# Patient Record
Sex: Female | Born: 1965 | Race: White | Hispanic: No | Marital: Married
Health system: Southern US, Community
[De-identification: ages and names within clinical notes are randomized; demographics above are authoritative.]

---

## 2013-12-26 ENCOUNTER — Other Ambulatory Visit: Payer: Self-pay | Admitting: Obstetrics & Gynecology

## 2013-12-26 DIAGNOSIS — N644 Mastodynia: Secondary | ICD-10-CM

## 2013-12-29 ENCOUNTER — Ambulatory Visit
Admission: RE | Admit: 2013-12-29 | Discharge: 2013-12-29 | Disposition: A | Discharge status: Home / Self Care | Payer: 59 | Source: Ambulatory Visit | Attending: Obstetrics & Gynecology | Admitting: Obstetrics & Gynecology

## 2013-12-29 DIAGNOSIS — N644 Mastodynia: Secondary | ICD-10-CM

## 2014-01-02 ENCOUNTER — Other Ambulatory Visit: Payer: Self-pay

## 2014-08-13 ENCOUNTER — Other Ambulatory Visit: Payer: Self-pay | Admitting: Obstetrics & Gynecology

## 2014-08-13 DIAGNOSIS — N63 Unspecified lump in unspecified breast: Secondary | ICD-10-CM

## 2014-08-18 ENCOUNTER — Other Ambulatory Visit: Payer: Self-pay

## 2014-08-19 ENCOUNTER — Ambulatory Visit
Admission: RE | Admit: 2014-08-19 | Discharge: 2014-08-19 | Disposition: A | Discharge status: Home / Self Care | Payer: BLUE CROSS/BLUE SHIELD | Source: Ambulatory Visit | Attending: Obstetrics & Gynecology | Admitting: Obstetrics & Gynecology

## 2014-08-19 DIAGNOSIS — N63 Unspecified lump in unspecified breast: Secondary | ICD-10-CM

## 2015-02-19 ENCOUNTER — Encounter (HOSPITAL_BASED_OUTPATIENT_CLINIC_OR_DEPARTMENT_OTHER): Payer: Self-pay

## 2015-02-19 ENCOUNTER — Ambulatory Visit (HOSPITAL_BASED_OUTPATIENT_CLINIC_OR_DEPARTMENT_OTHER): Admit: 2015-02-19 | Payer: BLUE CROSS/BLUE SHIELD | Admitting: Obstetrics & Gynecology

## 2015-02-19 SURGERY — LIGATION, FALLOPIAN TUBE, LAPAROSCOPIC
Anesthesia: General

## 2015-11-18 ENCOUNTER — Other Ambulatory Visit: Payer: Self-pay | Admitting: Nurse Practitioner

## 2015-11-18 DIAGNOSIS — N6320 Unspecified lump in the left breast, unspecified quadrant: Secondary | ICD-10-CM

## 2015-11-18 DIAGNOSIS — N6310 Unspecified lump in the right breast, unspecified quadrant: Secondary | ICD-10-CM

## 2015-11-23 ENCOUNTER — Ambulatory Visit
Admission: RE | Admit: 2015-11-23 | Discharge: 2015-11-23 | Disposition: A | Discharge status: Home / Self Care | Payer: BLUE CROSS/BLUE SHIELD | Source: Ambulatory Visit | Attending: Nurse Practitioner | Admitting: Nurse Practitioner

## 2015-11-23 DIAGNOSIS — N6310 Unspecified lump in the right breast, unspecified quadrant: Secondary | ICD-10-CM

## 2015-11-23 DIAGNOSIS — N6320 Unspecified lump in the left breast, unspecified quadrant: Secondary | ICD-10-CM

## 2018-12-18 ENCOUNTER — Other Ambulatory Visit: Payer: Self-pay | Admitting: Obstetrics & Gynecology

## 2018-12-18 DIAGNOSIS — N644 Mastodynia: Secondary | ICD-10-CM

## 2019-01-01 ENCOUNTER — Ambulatory Visit
Admission: RE | Admit: 2019-01-01 | Discharge: 2019-01-01 | Disposition: A | Discharge status: Home / Self Care | Payer: BC Managed Care – PPO | Source: Ambulatory Visit | Attending: Obstetrics & Gynecology | Admitting: Obstetrics & Gynecology

## 2019-01-01 ENCOUNTER — Other Ambulatory Visit: Payer: Self-pay

## 2019-01-01 ENCOUNTER — Ambulatory Visit: Payer: BLUE CROSS/BLUE SHIELD

## 2019-01-01 DIAGNOSIS — N644 Mastodynia: Secondary | ICD-10-CM

## 2020-03-30 IMAGING — MG DIGITAL DIAGNOSTIC BILATERAL MAMMOGRAM WITH TOMO AND CAD
8 series · 8 of 24 positions shown · non-contrast
Comparison: Previous exam(s).

CLINICAL DATA: 53-year-old female with diffuse bilateral breast
pain for several months.

EXAM:
DIGITAL DIAGNOSTIC BILATERAL MAMMOGRAM WITH CAD AND TOMO

[L MLO synth-2D]
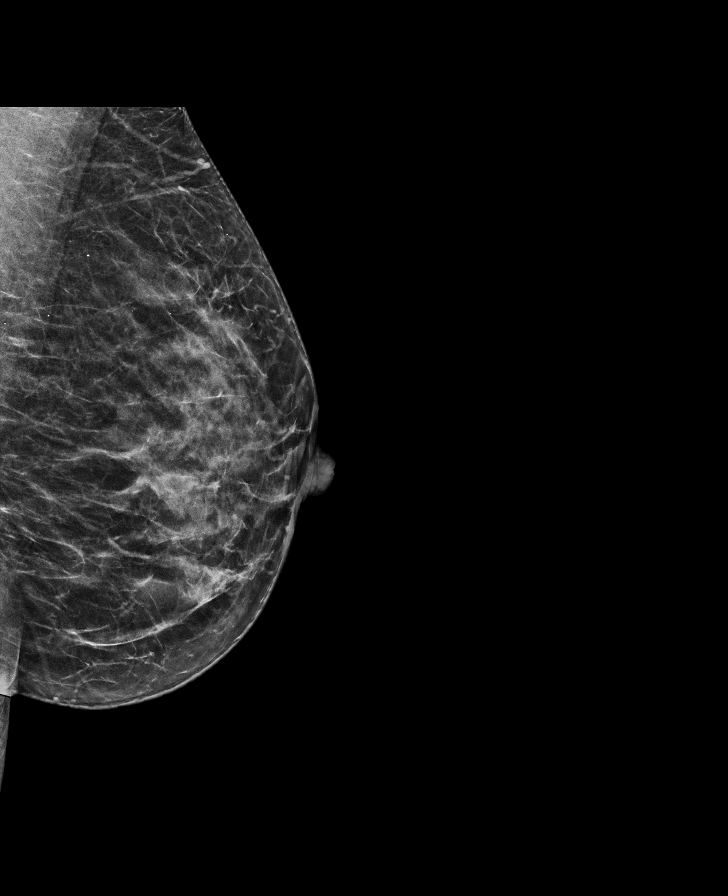

[R MLO synth-2D]
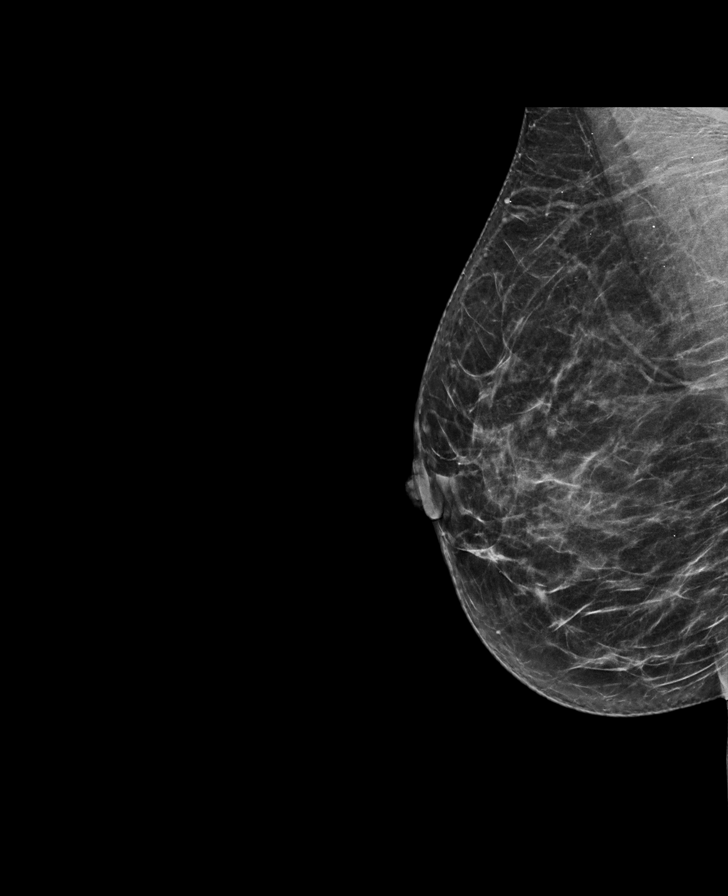

[R CC synth-2D]
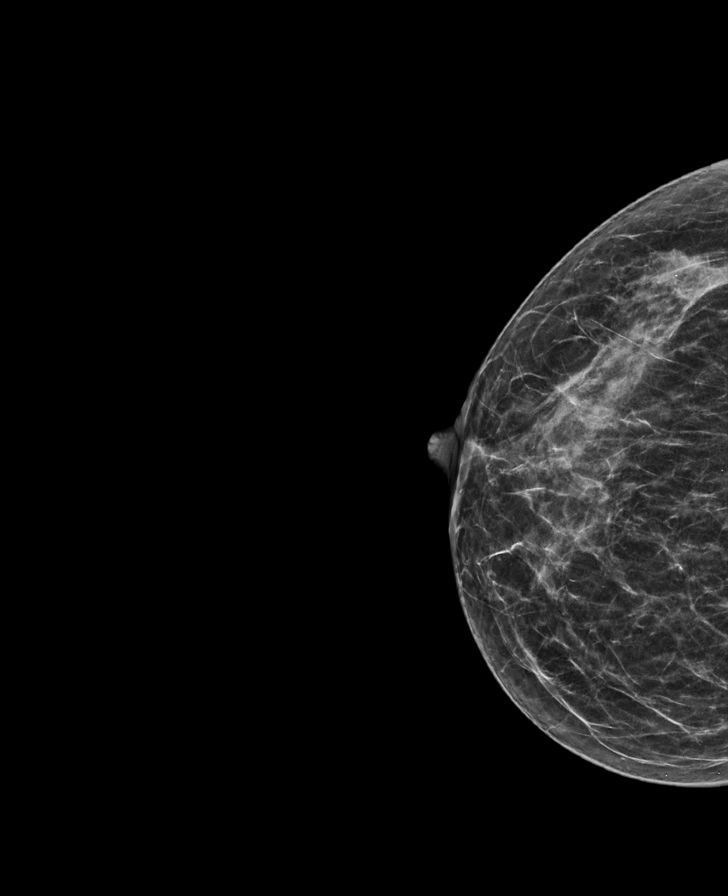

[L CC synth-2D]
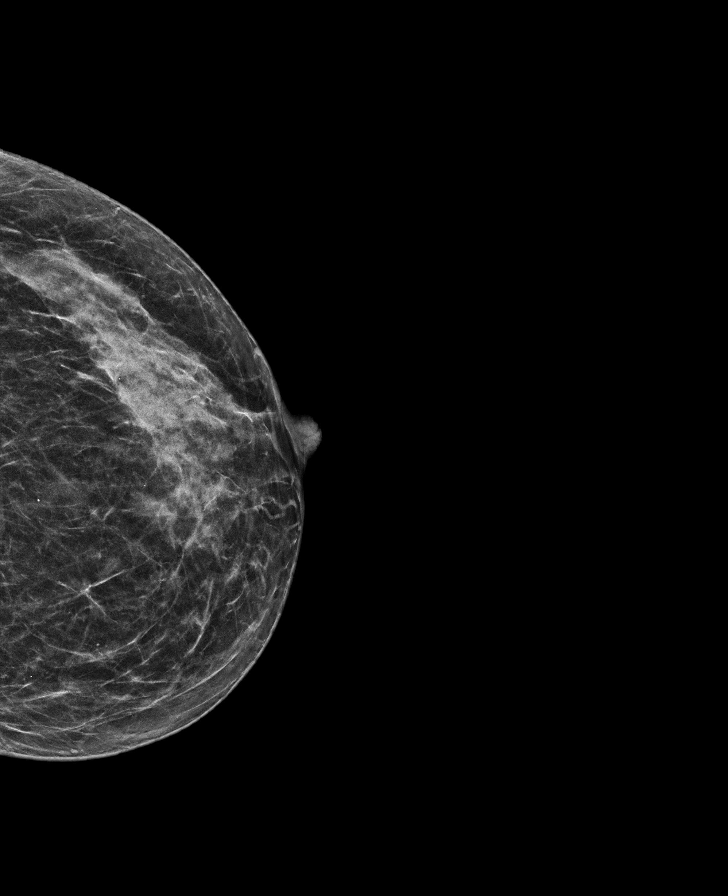

[L MLO tomo · tomo slice 29/57.0]
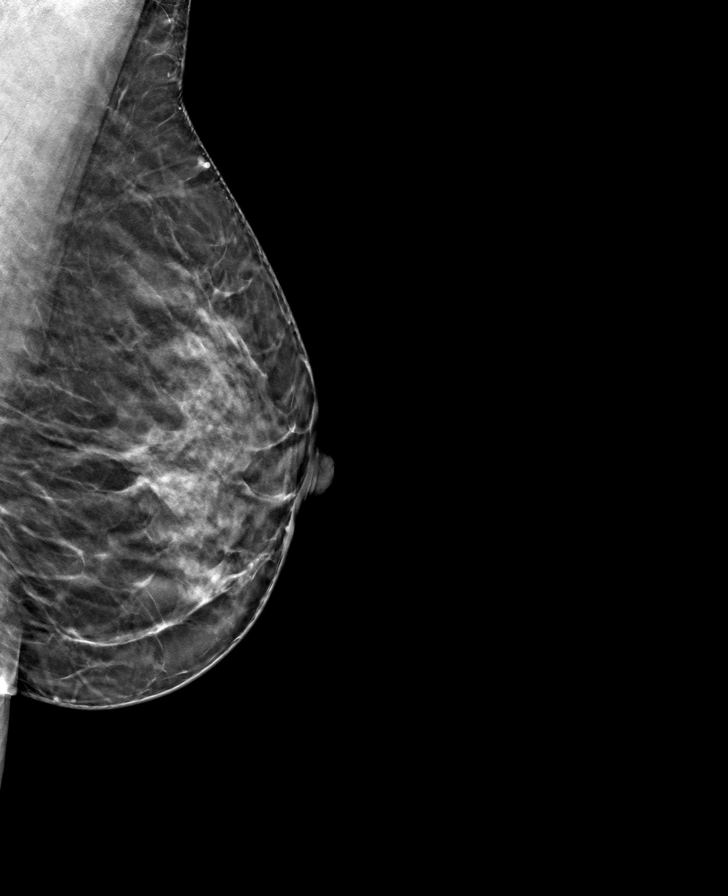

[R CC tomo · tomo slice 27/53.0]
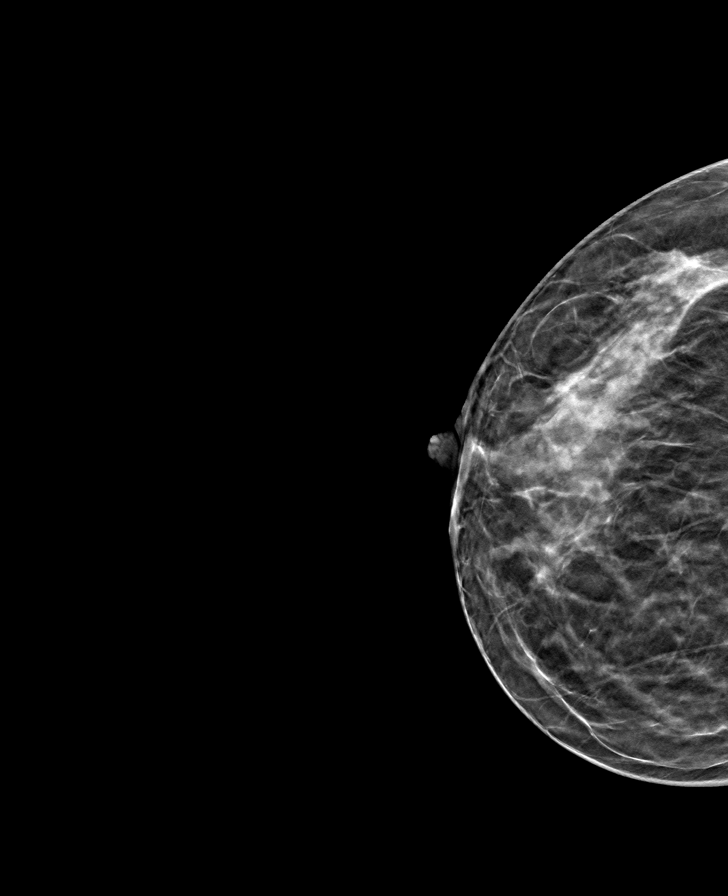

[L CC tomo · tomo slice 28/55.0]
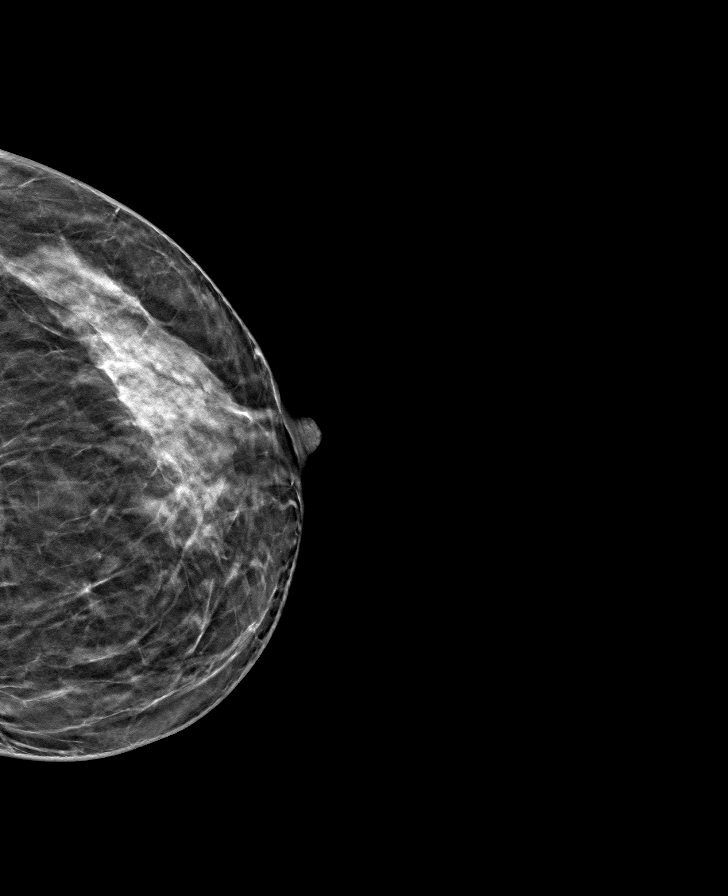

[R MLO tomo · tomo slice 31/60.0]
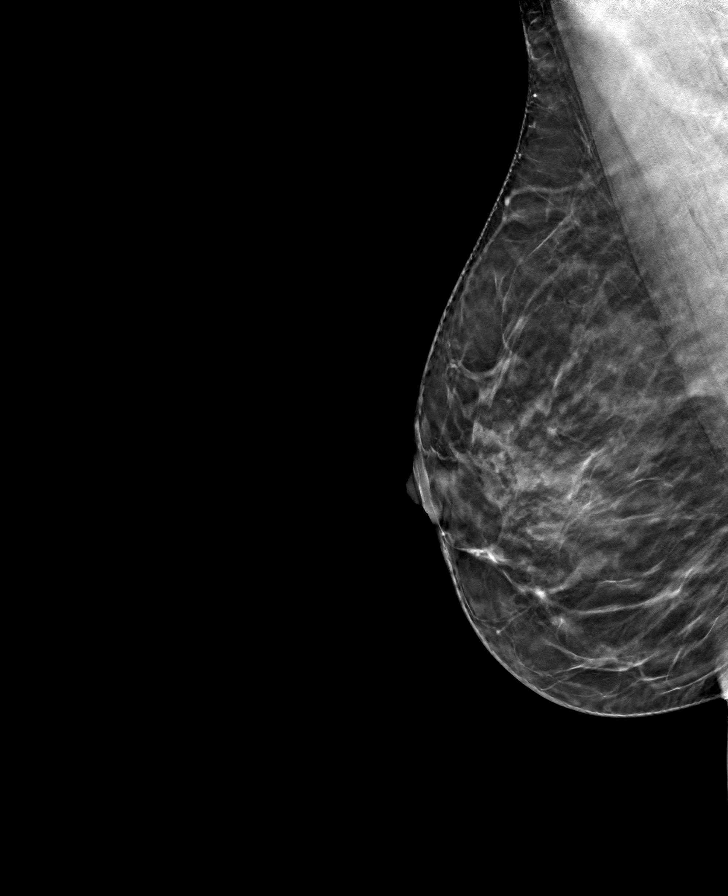

[8 of 24 positions shown; findings below may reference images not displayed]

ACR Breast Density Category c: The breast tissue is heterogeneously
dense, which may obscure small masses.
FINDINGS: 2D/3D full field views of both breasts demonstrate no suspicious
mass, distortion or worrisome calcifications

Mammographic images were processed with CAD.
IMPRESSION: No mammographic evidence of breast malignancy.

RECOMMENDATION:
Bilateral screening mammogram in 1 year

I have discussed the findings, causes and remedies of breast pain
and recommendations with the patient. If applicable, a reminder
letter will be sent to the patient regarding the next appointment.

BI-RADS CATEGORY  1: Negative.
